# Patient Record
Sex: Male | Born: 1985 | Race: White | Hispanic: No | Marital: Single | Smoking: Never smoker
Health system: Southern US, Community
[De-identification: ages and names within clinical notes are randomized; demographics above are authoritative.]

---
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
## 2017-07-04 ENCOUNTER — Emergency Department (HOSPITAL_COMMUNITY): Payer: BLUE CROSS/BLUE SHIELD

## 2017-07-04 ENCOUNTER — Other Ambulatory Visit: Payer: Self-pay

## 2017-07-04 ENCOUNTER — Emergency Department (HOSPITAL_COMMUNITY)
Admission: EM | Admit: 2017-07-04 | Discharge: 2017-07-04 | Disposition: A | Discharge status: Home / Self Care | Payer: BLUE CROSS/BLUE SHIELD | Attending: Emergency Medicine | Admitting: Emergency Medicine

## 2017-07-04 ENCOUNTER — Encounter (HOSPITAL_COMMUNITY): Payer: Self-pay | Admitting: *Deleted

## 2017-07-04 DIAGNOSIS — R1010 Upper abdominal pain, unspecified: Secondary | ICD-10-CM

## 2017-07-04 LAB — CBC
HEMATOCRIT: 43.7 % (ref 39.0–52.0)
Hemoglobin: 14.8 g/dL (ref 13.0–17.0)
MCH: 28.8 pg (ref 26.0–34.0)
MCHC: 33.9 g/dL (ref 30.0–36.0)
MCV: 85.2 fL (ref 78.0–100.0)
PLATELETS: 250 10*3/uL (ref 150–400)
RBC: 5.13 MIL/uL (ref 4.22–5.81)
RDW: 12.3 % (ref 11.5–15.5)
WBC: 8.7 10*3/uL (ref 4.0–10.5)

## 2017-07-04 LAB — COMPREHENSIVE METABOLIC PANEL
ALT: 27 U/L (ref 17–63)
AST: 27 U/L (ref 15–41)
Albumin: 4.4 g/dL (ref 3.5–5.0)
Alkaline Phosphatase: 93 U/L (ref 38–126)
Anion gap: 9 (ref 5–15)
BILIRUBIN TOTAL: 0.3 mg/dL (ref 0.3–1.2)
BUN: 25 mg/dL — AB (ref 6–20)
CO2: 24 mmol/L (ref 22–32)
CREATININE: 0.91 mg/dL (ref 0.61–1.24)
Calcium: 9.2 mg/dL (ref 8.9–10.3)
Chloride: 104 mmol/L (ref 101–111)
GFR calc Af Amer: >60 mL/min (ref >60)
Glucose, Bld: 123 mg/dL — ABNORMAL HIGH (ref 65–99)
POTASSIUM: 4.1 mmol/L (ref 3.5–5.1)
Sodium: 137 mmol/L (ref 135–145)
TOTAL PROTEIN: 7.8 g/dL (ref 6.5–8.1)

## 2017-07-04 LAB — URINALYSIS, ROUTINE W REFLEX MICROSCOPIC
Bilirubin Urine: NEGATIVE
Glucose, UA: NEGATIVE mg/dL
Hgb urine dipstick: NEGATIVE
KETONES UR: NEGATIVE mg/dL
LEUKOCYTES UA: NEGATIVE
NITRITE: NEGATIVE
PROTEIN: NEGATIVE mg/dL
Specific Gravity, Urine: 1.024 (ref 1.005–1.030)
pH: 5 (ref 5.0–8.0)

## 2017-07-04 LAB — LIPASE, BLOOD: Lipase: 25 U/L (ref 11–51)

## 2017-07-04 MED ORDER — PANTOPRAZOLE SODIUM 20 MG PO TBEC: 20.0000 mg | DELAYED_RELEASE_TABLET | Freq: Every day | ORAL | 0 refills | Status: AC

## 2017-07-04 NOTE — ED Provider Notes (Signed)
[facility] EMERGENCY DEPARTMENT Provider Note   CSN: [id number] Arrival date & time: 07/04/17  1511     History   Chief Complaint Chief Complaint  Patient presents with  . Abdominal Pain    HPI Edward Stephens is a 32 y.o. male.  Patient complains of epigastric fullness.  Is been going on for weeks.  The history is provided by the patient.  Abdominal Pain   This is a new problem. The current episode started more than 1 week ago. The problem occurs constantly. The problem has not changed since onset.The pain is associated with an unknown factor. The pain is located in the epigastric region. The quality of the pain is aching. The pain is at a severity of 2/10. The pain is mild. Associated symptoms include anorexia. Pertinent negatives include diarrhea, frequency, hematuria and headaches. Nothing aggravates the symptoms.    History reviewed. No pertinent past medical history.  There are no active problems to display for this patient.   History reviewed. No pertinent surgical history.      Home Medications    Prior to Admission medications   Medication Sig Start Date End Date Taking? Authorizing Provider  doxycycline (VIBRA-TABS) 100 MG tablet Take 100 mg by mouth 2 (two) times daily. 06/23/17  Yes [provider]  omeprazole (PRILOSEC OTC) 20 MG tablet Take 20 mg by mouth daily. 06/23/17 07/07/17 Yes [provider]  pantoprazole (PROTONIX) 20 MG tablet Take 1 tablet (20 mg total) by mouth daily. 07/04/17   Bethann BerkshireZammit, Khyre Germond, MD    Family History No family history on file.  Social History Social History   Tobacco Use  . Smoking status: Never Smoker  . Smokeless tobacco: Never Used  Substance Use Topics  . Alcohol use: Yes    Comment: "once in awhile"   . Drug use: Never     Allergies   Shellfish allergy and Penicillins   Review of Systems Review of Systems  Constitutional: Negative for appetite change and fatigue.  HENT: Negative for  congestion, ear discharge and sinus pressure.   Eyes: Negative for discharge.  Respiratory: Negative for cough.   Cardiovascular: Negative for chest pain.  Gastrointestinal: Positive for abdominal pain and anorexia. Negative for diarrhea.  Genitourinary: Negative for frequency and hematuria.  Musculoskeletal: Negative for back pain.  Skin: Negative for rash.  Neurological: Negative for seizures and headaches.  Psychiatric/Behavioral: Negative for hallucinations.     Physical Exam Updated Vital Signs BP (!) 148/75 (BP Location: Right Arm)   Pulse 82   Temp 98.7 F (37.1 C) (Oral)   Resp 16   Ht 5\' 10"  (1.778 m)   Wt 80.7 kg (178 lb)   SpO2 99%   BMI 25.54 kg/m   Physical Exam  Constitutional: He is oriented to person, place, and time. He appears well-developed.  HENT:  Head: Normocephalic.  Eyes: Conjunctivae and EOM are normal. No scleral icterus.  Neck: Neck supple. No thyromegaly present.  Cardiovascular: Normal rate and regular rhythm. Exam reveals no gallop and no friction rub.  No murmur heard. Pulmonary/Chest: No stridor. He has no wheezes. He has no rales. He exhibits no tenderness.  Abdominal: He exhibits no distension. There is no tenderness. There is no rebound.  Musculoskeletal: Normal range of motion. He exhibits no edema.  Lymphadenopathy:    He has no cervical adenopathy.  Neurological: He is oriented to person, place, and time. He exhibits normal muscle tone. Coordination normal.  Skin: No rash noted. No erythema.  Psychiatric: He has a normal mood and affect. His behavior is normal.     ED Treatments / Results  Labs (all labs ordered are listed, but only abnormal results are displayed) Labs Reviewed  COMPREHENSIVE METABOLIC PANEL - Abnormal; Notable for the following components:      Result Value   Glucose, Bld 123 (*)    BUN 25 (*)    All other components within normal limits  LIPASE, BLOOD  CBC  URINALYSIS, ROUTINE W REFLEX MICROSCOPIC     EKG None  Radiology Dg Abd Acute W/chest  Result Date: 07/04/2017 CLINICAL DATA:  Stomach virus 1 month ago, abdominal bloating and fullness, loss of appetite, weight loss EXAM: DG ABDOMEN ACUTE W/ 1V CHEST COMPARISON:  None FINDINGS: Normal heart size, mediastinal contours, and pulmonary vascularity. Lungs clear. No pleural effusion or pneumothorax. Bowel gas pattern normal. Stool throughout colon to rectum. No bowel dilatation, bowel wall thickening, or free air. Osseous structures unremarkable. No definite urinary tract calcification. IMPRESSION: Normal exam. Electronically Signed   By: Ulyses Southward M.D.   On: 07/04/2017 17:46    Procedures Procedures (including critical care time)  Medications Ordered in ED Medications - No data to display   Initial Impression / Assessment and Plan / ED Course  I have reviewed the triage vital signs and the nursing notes.  Pertinent labs & imaging results that were available during my care of the patient were reviewed by me and considered in my medical decision making (see chart for details).     Labs and abdominal series unremarkable.  Patient will be placed on Protonix to see if that helps with his symptoms.  He has been referred to GI or he can follow-up with his family doctor  Final Clinical Impressions(s) / ED Diagnoses   Final diagnoses:  Pain of upper abdomen    ED Discharge Orders        Ordered    pantoprazole (PROTONIX) 20 MG tablet  Daily     07/04/17 1847       Bethann Berkshire, MD 07/04/17 1851

## 2017-07-04 NOTE — ED Notes (Signed)
EDP at bedside  

## 2017-07-04 NOTE — ED Triage Notes (Signed)
Pt c/o he had a stomach virus 1 month ago that lasted for about 12 months and ever since then whenever he eats he feels "full" very quickly. Pt reports he hasn't been able to eat like he normally does. Denies n/v/d since he had the stomach virus.

## 2017-07-04 NOTE — Discharge Instructions (Addendum)
Follow up with dr. Rourk or a family md in 2-3 weeks for recheck. 

## 2019-07-23 IMAGING — DX DG ABDOMEN ACUTE W/ 1V CHEST
3 series · 3 of 3 positions shown · non-contrast
Comparison: None

CLINICAL DATA: Stomach virus 1 month ago, abdominal bloating and
fullness, loss of appetite, weight loss

EXAM:
DG ABDOMEN ACUTE W/ 1V CHEST

[chest pa]
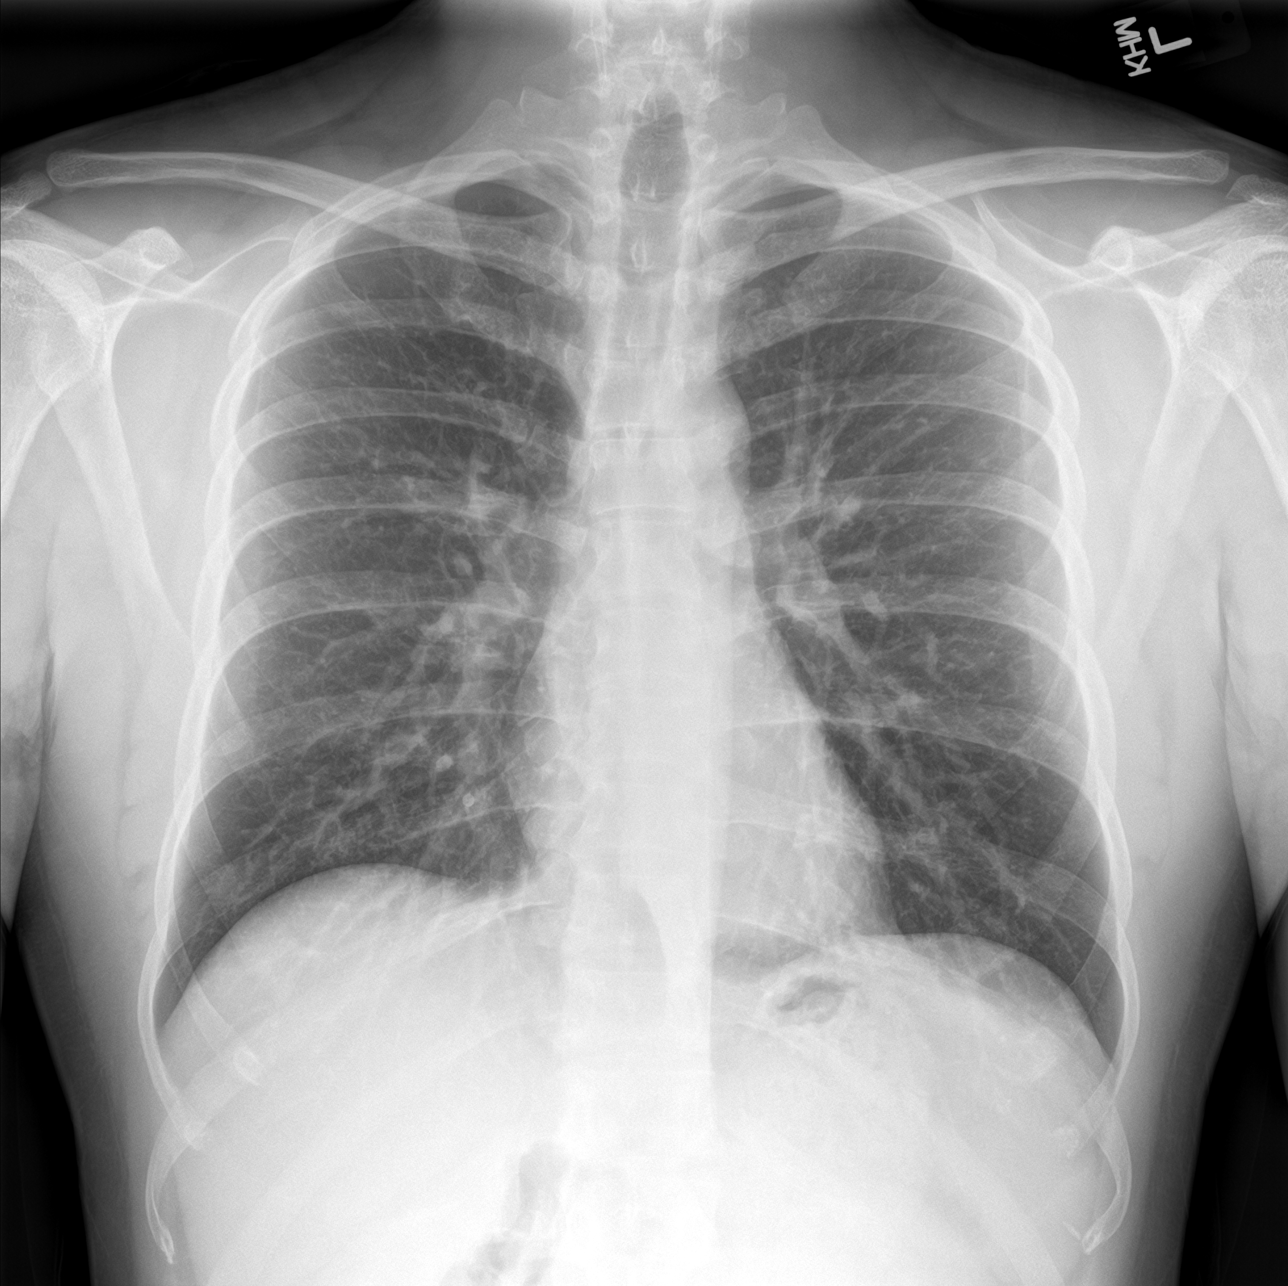

[abdomen erect]
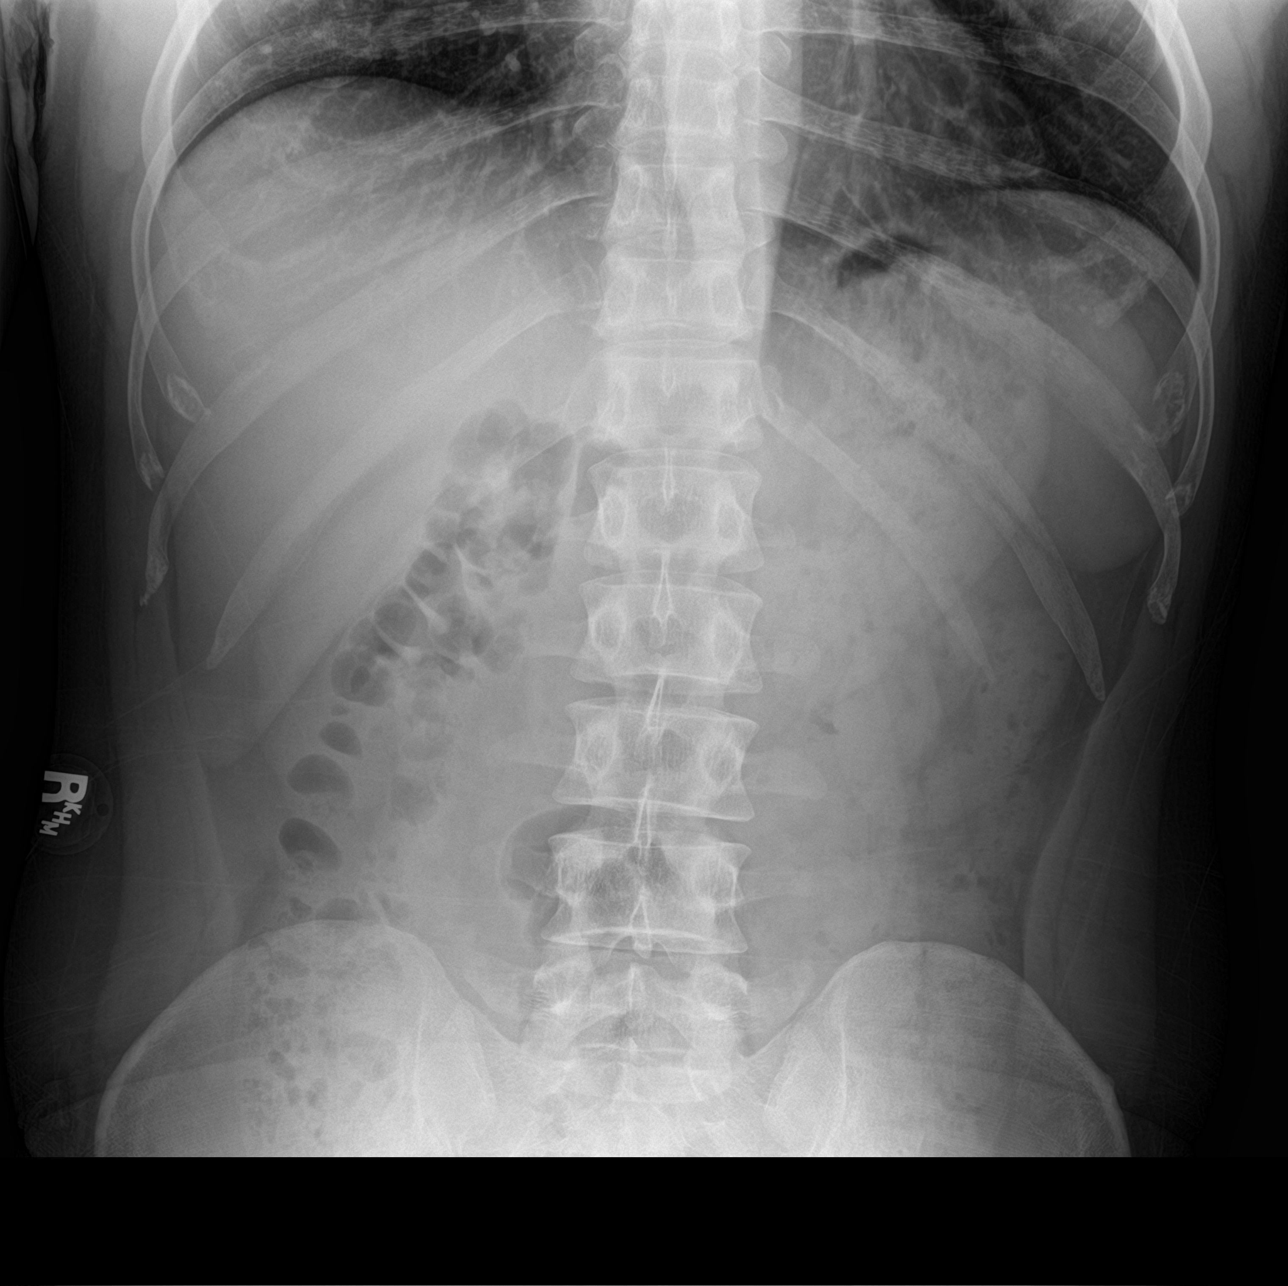

[abdomen supine]
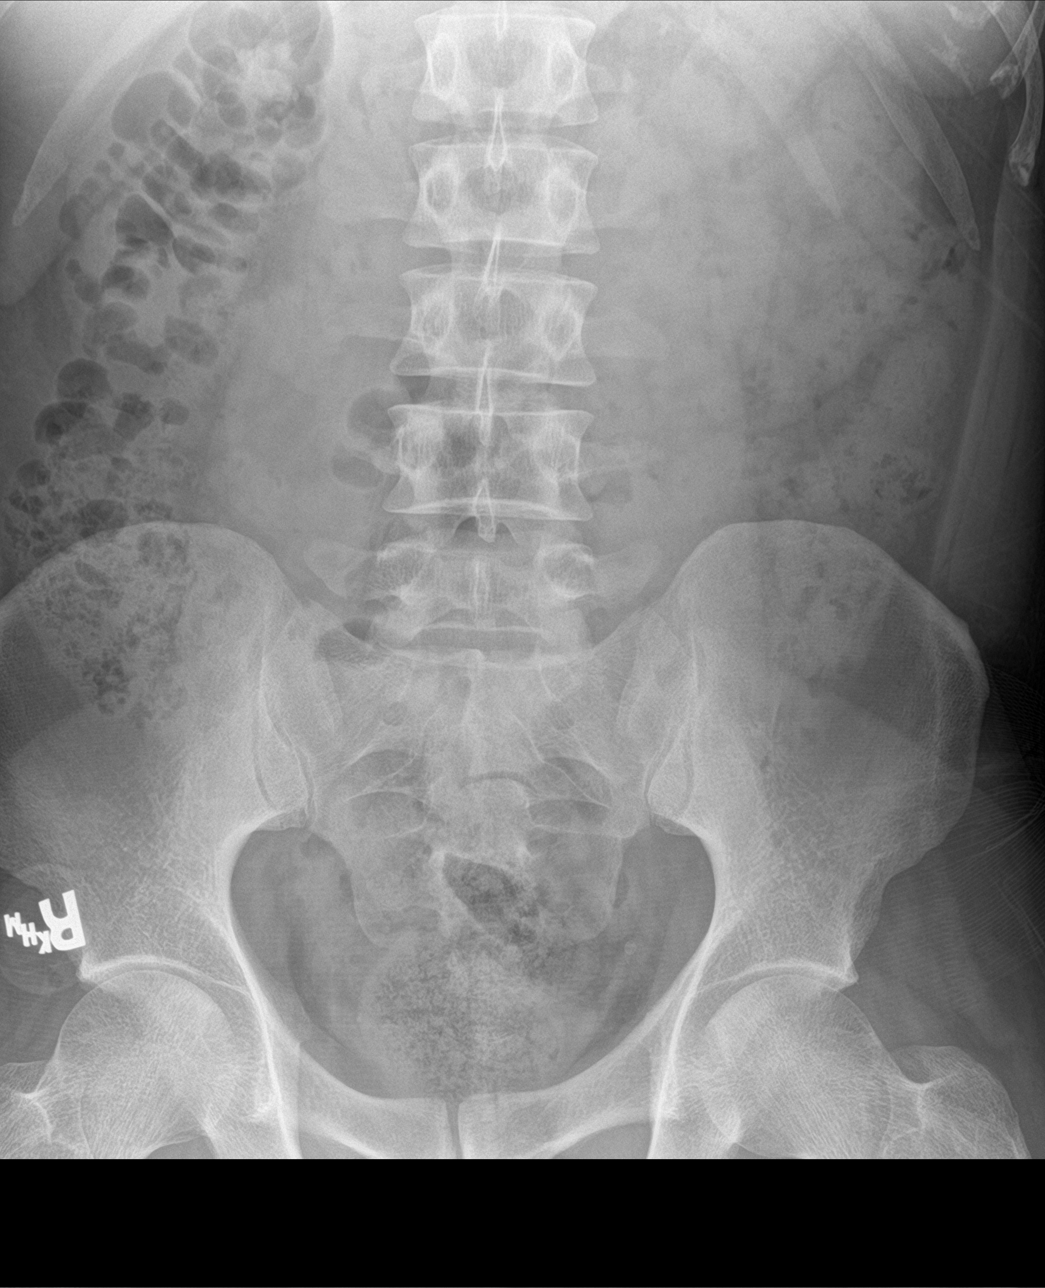

[3 of 3 positions shown; findings below may reference images not displayed]

FINDINGS: Normal heart size, mediastinal contours, and pulmonary vascularity.

Lungs clear.

No pleural effusion or pneumothorax.

Bowel gas pattern normal.

Stool throughout colon to rectum.

No bowel dilatation, bowel wall thickening, or free air.

Osseous structures unremarkable.

No definite urinary tract calcification.
IMPRESSION: Normal exam.
